# Patient Record
Sex: Male | Born: 1955 | Race: White | Hispanic: No | Marital: Married | Smoking: Never smoker
Health system: Southern US, Community
[De-identification: ages and names within clinical notes are randomized; demographics above are authoritative.]

---

## 2014-11-11 ENCOUNTER — Ambulatory Visit (INDEPENDENT_AMBULATORY_CARE_PROVIDER_SITE_OTHER): Payer: BLUE CROSS/BLUE SHIELD

## 2014-11-11 ENCOUNTER — Ambulatory Visit (INDEPENDENT_AMBULATORY_CARE_PROVIDER_SITE_OTHER): Payer: BLUE CROSS/BLUE SHIELD | Admitting: Family Medicine

## 2014-11-11 VITALS — BP 144/76 | HR 66 | Temp 98.5°F | Resp 16 | Ht 69.0 in | Wt 225.0 lb

## 2014-11-11 DIAGNOSIS — M5431 Sciatica, right side: Secondary | ICD-10-CM | POA: Diagnosis not present

## 2014-11-11 DIAGNOSIS — R030 Elevated blood-pressure reading, without diagnosis of hypertension: Secondary | ICD-10-CM

## 2014-11-11 DIAGNOSIS — M5441 Lumbago with sciatica, right side: Secondary | ICD-10-CM

## 2014-11-11 MED ORDER — CYCLOBENZAPRINE HCL 5 MG PO TABS: ORAL_TABLET | ORAL | Status: DC

## 2014-11-11 MED ORDER — MELOXICAM 7.5 MG PO TABS: 7.5000 mg | ORAL_TABLET | Freq: Every day | ORAL | Status: DC

## 2014-11-11 NOTE — Progress Notes (Signed)
Subjective:  This chart was scribed for Joshua Staggers, MD by [facility], medical scribe at [facility].The patient was seen in exam room 02 and the patient's care was started at 8:55 AM.   Patient ID: Joshua Wallace, male    DOB: 02-25-1956, 59 y.o.   MRN: [id number] Chief Complaint  Patient presents with  . Back Pain    right side, pain starts in lower back and goes down right leg. x 2 weeks  . Numbness    x 2 weeks   HPI HPI Comments: Joshua Wallace is a 59 y.o. male who presents to [facility] complaining of lower back pain that radiates down his right leg up to his calf with associated numbness. Numbness worsened with walking and radiates down to his foot. No known injury. No weakness of his right leg. Typically takes two Advil in the morning. 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 Walks frequently. 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 Injured his back then, so surgeries of injections at that time. No chest pain, leg swelling and sob. He does not smoke. No hx of HTN or blood clot. No urine or bowel incontinence, saddle anesthesia.  There are no active problems to display for this patient.  History reviewed. No pertinent past medical history. History reviewed. No pertinent past surgical history. Not on File Prior to Admission medications   Not on File   Social History   Social History  . Marital Status: Married    Spouse Name: N/A  . Number of Children: N/A  . Years of Education: N/A   Occupational History  . Not on file.   Social History Main Topics  . Smoking status: Never Smoker   . Smokeless tobacco: Not on file  . Alcohol Use: Not on file  . Drug Use: Not on file  . Sexual Activity: Not on file   Other Topics Concern  . Not on file   Social History Narrative  . No narrative on file   Review of Systems  Respiratory: Negative for shortness of breath.   Cardiovascular: Negative for chest pain and leg  swelling.  Musculoskeletal: Positive for back pain.  Neurological: Positive for numbness. Negative for weakness.       Objective:  BP 142/80 mmHg  Pulse 66  Temp(Src) 98.5 F (36.9 C) (Oral)  Resp 16  Ht  (1.753 m)  Wt 225 lb (102.059 kg)  BMI 33.21 kg/m2  SpO2 99% Physical Exam  Constitutional: He is oriented to person, place, and time. He appears well-developed and well-nourished. No distress.  HENT:  Head: Normocephalic and atraumatic.  Eyes: Pupils are equal, round, and reactive to light.  Neck: Normal range of motion.  Cardiovascular: Normal rate and regular rhythm.   Pulmonary/Chest: Effort normal. No respiratory distress.  Musculoskeletal: Normal range of motion.  Lumbar spine: Minimal discomfort paraspinal, and SI joint. Most tender at the sciatic notch. Minimal bony tenderness. Full flexion. Able to heel and toe walk w/o difficulty. Negative seated straight leg raise on the right.  Neurological: He is alert and oriented to person, place, and time. He displays no Babinski's sign on the right side. He displays no Babinski's sign on the left side.  Reflex Scores:      Patellar reflexes are 2+ on the right side and 2+ on the left side.      Achilles reflexes are 2+ on the right side and 2+ on  the left side. Skin: Skin is warm and dry.  Psychiatric: He has a normal mood and affect. His behavior is normal.  Nursing note and vitals reviewed. [facility] reading (PRIMARY) by Dr.Syesha Thaw of lumbar spine: slight right scoliosis, degenerative changes with osteophytes of L2, L3, L4. Decreased disc space L4-L5 and bridging osteophytes L4-L5      Assessment & Plan:   Mylen Mangan is a 59 y.o. male Right sciatic nerve pain - Plan: DG Lumbar Spine Complete, meloxicam (MOBIC) 7.5 MG tablet, cyclobenzaprine (FLEXERIL) 5 MG tablet  Right-sided low back pain with right-sided sciatica - Plan: DG Lumbar Spine Complete, meloxicam (MOBIC) 7.5 MG tablet, cyclobenzaprine (FLEXERIL) 5 MG  tablet  Elevated blood pressure reading without diagnosis of hypertension   -Suspected right sided sciatica, and based on x-ray may be L4-5 discogenic cause. Strength intact, reflexes equal and intact.    -Will try Mobic 7.5 mg daily, avoid NSAIDs. Watch blood pressures on this medication, as borderline here today. No known history of hypertension. If blood pressures remain elevated, stop Mobic and call for other instructions.  -Flexeril up to every 8 hours, side effects discussed, Su can try initially at night.  -Symptomatic care and handout given on sciatica. If not improving in the next week, consider prednisone. If worsening, consider orthopedic eval or MRI.   Meds ordered this encounter  Medications  . meloxicam (MOBIC) 7.5 MG tablet    Sig: Take 1 tablet (7.5 mg total) by mouth daily.    Dispense:  30 tablet    Refill:  0  . cyclobenzaprine (FLEXERIL) 5 MG tablet    Sig: 1 pill by mouth up to every 8 hours as needed. Start with one pill by mouth each bedtime as needed due to sedation    Dispense:  15 tablet    Refill:  0   Patient Instructions  Try the exercises and other information as listed below, meloxicam once during the day as needed (avoid taking other NSAIDS like Alleve or Ibuprofen while taking this) and then flexeril if needed at bedtime for muscle spasm. This can be taken up to every 8 hours, but causes sedation, so should not drive or operate heavy machinery while taking this medicine. Keep a record of your blood pressures outside of the office and if remaining over 140/90, we may need to stop the meloxicam has it can elevate blood pressure similarly to ibuprofen. Try to avoid repetitive bending or twisting, as well as heavy lifting over this next week. Follow-up with me in 1 week if your symptoms are not improving, as we may need to try prednisone or possible MRI of your back depending on your symptoms.Return to the clinic or go to the nearest emergency room if any of your  symptoms worsen or new symptoms occur.  Sciatica with Rehab The sciatic nerve runs from the back down the leg and is responsible for sensation and control of the muscles in the back (posterior) side of the thigh, lower leg, and foot. Sciatica is a condition that is characterized by inflammation of this nerve.  SYMPTOMS   Signs of nerve damage, including numbness and/or weakness along the posterior side of the lower extremity.  Pain in the back of the thigh that may also travel down the leg.  Pain that worsens when sitting for long periods of time.  Occasionally, pain in the back or buttock. CAUSES  Inflammation of the sciatic nerve is the cause of sciatica. The inflammation is due to something irritating the  nerve. Common sources of irritation include:  Sitting for long periods of time.  Direct trauma to the nerve.  Arthritis of the spine.  Herniated or ruptured disk.  Slipping of the vertebrae (spondylolisthesis).  Pressure from soft tissues, such as muscles or ligament-like tissue (fascia). RISK INCREASES WITH:  Sports that place pressure or stress on the spine (football or weightlifting).  Poor strength and flexibility.  Failure to warm up properly before activity.  Family history of low back pain or disk disorders.  Previous back injury or surgery.  Poor body mechanics, especially when lifting, or poor posture. PREVENTION   Warm up and stretch properly before activity.  Maintain physical fitness:  Strength, flexibility, and endurance.  Cardiovascular fitness.  Learn and use proper technique, especially with posture and lifting. When possible, have coach correct improper technique.  Avoid activities that place stress on the spine. PROGNOSIS If treated properly, then sciatica usually resolves within 6 weeks. However, occasionally surgery is necessary.  RELATED COMPLICATIONS   Permanent nerve damage, including pain, numbness, tingle, or weakness.  Chronic  back pain.  Risks of surgery: infection, bleeding, nerve damage, or damage to surrounding tissues. TREATMENT Treatment initially involves resting from any activities that aggravate your symptoms. The use of ice and medication may help reduce pain and inflammation. The use of strengthening and stretching exercises may help reduce pain with activity. These exercises may be performed at home or with referral to a therapist. A therapist may recommend further treatments, such as transcutaneous electronic nerve stimulation (TENS) or ultrasound. Your caregiver may recommend corticosteroid injections to help reduce inflammation of the sciatic nerve. If symptoms persist despite non-surgical (conservative) treatment, then surgery may be recommended. MEDICATION  If pain medication is necessary, then nonsteroidal anti-inflammatory medications, such as aspirin and ibuprofen, or other minor pain relievers, such as acetaminophen, are often recommended.  Do not take pain medication for 7 days before surgery.  Prescription pain relievers may be given if deemed necessary by your caregiver. Use only as directed and only as much as you need.  Ointments applied to the skin may be helpful.  Corticosteroid injections may be given by your caregiver. These injections should be reserved for the most serious cases, because they may only be given a certain number of times. HEAT AND COLD  Cold treatment (icing) relieves pain and reduces inflammation. Cold treatment should be applied for 10 to 15 minutes every 2 to 3 hours for inflammation and pain and immediately after any activity that aggravates your symptoms. Use ice packs or massage the area with a piece of ice (ice massage).  Heat treatment may be used prior to performing the stretching and strengthening activities prescribed by your caregiver, physical therapist, or athletic trainer. Use a heat pack or soak the injury in warm water. SEEK MEDICAL CARE IF:  Treatment  seems to offer no benefit, or the condition worsens.  Any medications produce adverse side effects. EXERCISES  RANGE OF MOTION (ROM) AND STRETCHING EXERCISES - Sciatica Most people with sciatic will find that their symptoms worsen with either excessive bending forward (flexion) or arching at the low back (extension). The exercises which will help resolve your symptoms will focus on the opposite motion. Your physician, physical therapist or athletic trainer will help you determine which exercises will be most helpful to resolve your low back pain. Do not complete any exercises without first consulting with your clinician. Discontinue any exercises which worsen your symptoms until you speak to your clinician. If you have  pain, numbness or tingling which travels down into your buttocks, leg or foot, the goal of the therapy is for these symptoms to move closer to your back and eventually resolve. Occasionally, these leg symptoms will get better, but your low back pain may worsen; this is typically an indication of progress in your rehabilitation. Be certain to be very alert to any changes in your symptoms and the activities in which you participated in the 24 hours prior to the change. Sharing this information with your clinician will allow him/her to most efficiently treat your condition. These exercises may help you when beginning to rehabilitate your injury. Your symptoms may resolve with or without further involvement from your physician, physical therapist or athletic trainer. While completing these exercises, remember:   Restoring tissue flexibility helps normal motion to return to the joints. This allows healthier, less painful movement and activity.  An effective stretch should be held for at least 30 seconds.  A stretch should never be painful. You should only feel a gentle lengthening or release in the stretched tissue. FLEXION RANGE OF MOTION AND STRETCHING EXERCISES: STRETCH - Flexion, Single  Knee to Chest   Lie on a firm bed or floor with both legs extended in front of you.  Keeping one leg in contact with the floor, bring your opposite knee to your chest. Hold your leg in place by either grabbing behind your thigh or at your knee.  Pull until you feel a gentle stretch in your low back. Hold __________ seconds.  Slowly release your grasp and repeat the exercise with the opposite side. Repeat __________ times. Complete this exercise __________ times per day.  STRETCH - Flexion, Double Knee to Chest  Lie on a firm bed or floor with both legs extended in front of you.  Keeping one leg in contact with the floor, bring your opposite knee to your chest.  Tense your stomach muscles to support your back and then lift your other knee to your chest. Hold your legs in place by either grabbing behind your thighs or at your knees.  Pull both knees toward your chest until you feel a gentle stretch in your low back. Hold __________ seconds.  Tense your stomach muscles and slowly return one leg at a time to the floor. Repeat __________ times. Complete this exercise __________ times per day.  STRETCH - Low Trunk Rotation   Lie on a firm bed or floor. Keeping your legs in front of you, bend your knees so they are both pointed toward the ceiling and your feet are flat on the floor.  Extend your arms out to the side. This will stabilize your upper body by keeping your shoulders in contact with the floor.  Gently and slowly drop both knees together to one side until you feel a gentle stretch in your low back. Hold for __________ seconds.  Tense your stomach muscles to support your low back as you bring your knees back to the starting position. Repeat the exercise to the other side. Repeat __________ times. Complete this exercise __________ times per day  EXTENSION RANGE OF MOTION AND FLEXIBILITY EXERCISES: STRETCH - Extension, Prone on Elbows  Lie on your stomach on the floor, a bed will  be too soft. Place your palms about shoulder width apart and at the height of your head.  Place your elbows under your shoulders. If this is too painful, stack pillows under your chest.  Allow your body to relax so that your hips drop  lower and make contact more completely with the floor.  Hold this position for __________ seconds.  Slowly return to lying flat on the floor. Repeat __________ times. Complete this exercise __________ times per day.  RANGE OF MOTION - Extension, Prone Press Ups  Lie on your stomach on the floor, a bed will be too soft. Place your palms about shoulder width apart and at the height of your head.  Keeping your back as relaxed as possible, slowly straighten your elbows while keeping your hips on the floor. You may adjust the placement of your hands to maximize your comfort. As you gain motion, your hands will come more underneath your shoulders.  Hold this position __________ seconds.  Slowly return to lying flat on the floor. Repeat __________ times. Complete this exercise __________ times per day.  STRENGTHENING EXERCISES - Sciatica  These exercises may help you when beginning to rehabilitate your injury. These exercises should be done near your "sweet spot." This is the neutral, low-back arch, somewhere between fully rounded and fully arched, that is your least painful position. When performed in this safe range of motion, these exercises can be used for people who have either a flexion or extension based injury. These exercises may resolve your symptoms with or without further involvement from your physician, physical therapist or athletic trainer. While completing these exercises, remember:   Muscles can gain both the endurance and the strength needed for everyday activities through controlled exercises.  Complete these exercises as instructed by your physician, physical therapist or athletic trainer. Progress with the resistance and repetition exercises only  as your caregiver advises.  You may experience muscle soreness or fatigue, but the pain or discomfort you are trying to eliminate should never worsen during these exercises. If this pain does worsen, stop and make certain you are following the directions exactly. If the pain is still present after adjustments, discontinue the exercise until you can discuss the trouble with your clinician. STRENGTHENING - Deep Abdominals, Pelvic Tilt   Lie on a firm bed or floor. Keeping your legs in front of you, bend your knees so they are both pointed toward the ceiling and your feet are flat on the floor.  Tense your lower abdominal muscles to press your low back into the floor. This motion will rotate your pelvis so that your tail bone is scooping upwards rather than pointing at your feet or into the floor.  With a gentle tension and even breathing, hold this position for __________ seconds. Repeat __________ times. Complete this exercise __________ times per day.  STRENGTHENING - Abdominals, Crunches   Lie on a firm bed or floor. Keeping your legs in front of you, bend your knees so they are both pointed toward the ceiling and your feet are flat on the floor. Cross your arms over your chest.  Slightly tip your chin down without bending your neck.  Tense your abdominals and slowly lift your trunk high enough to just clear your shoulder blades. Lifting higher can put excessive stress on the low back and does not further strengthen your abdominal muscles.  Control your return to the starting position. Repeat __________ times. Complete this exercise __________ times per day.  STRENGTHENING - Quadruped, Opposite UE/LE Lift  Assume a hands and knees position on a firm surface. Keep your hands under your shoulders and your knees under your hips. You may place padding under your knees for comfort.  Find your neutral spine and gently tense your abdominal muscles so  that you can maintain this position. Your  shoulders and hips should form a rectangle that is parallel with the floor and is not twisted.  Keeping your trunk steady, lift your right hand no higher than your shoulder and then your left leg no higher than your hip. Make sure you are not holding your breath. Hold this position __________ seconds.  Continuing to keep your abdominal muscles tense and your back steady, slowly return to your starting position. Repeat with the opposite arm and leg. Repeat __________ times. Complete this exercise __________ times per day.  STRENGTHENING - Abdominals and Quadriceps, Straight Leg Raise   Lie on a firm bed or floor with both legs extended in front of you.  Keeping one leg in contact with the floor, bend the other knee so that your foot can rest flat on the floor.  Find your neutral spine, and tense your abdominal muscles to maintain your spinal position throughout the exercise.  Slowly lift your straight leg off the floor about 6 inches for a count of 15, making sure to not hold your breath.  Still keeping your neutral spine, slowly lower your leg all the way to the floor. Repeat this exercise with each leg __________ times. Complete this exercise __________ times per day. POSTURE AND BODY MECHANICS CONSIDERATIONS - Sciatica Keeping correct posture when sitting, standing or completing your activities will reduce the stress put on different body tissues, allowing injured tissues a chance to heal and limiting painful experiences. The following are general guidelines for improved posture. Your physician or physical therapist will provide you with any instructions specific to your needs. While reading these guidelines, remember:  The exercises prescribed by your provider will help you have the flexibility and strength to maintain correct postures.  The correct posture provides the optimal environment for your joints to work. All of your joints have less wear and tear when properly supported by a spine  with good posture. This means you will experience a healthier, less painful body.  Correct posture must be practiced with all of your activities, especially prolonged sitting and standing. Correct posture is as important when doing repetitive low-stress activities (typing) as it is when doing a single heavy-load activity (lifting). RESTING POSITIONS Consider which positions are most painful for you when choosing a resting position. If you have pain with flexion-based activities (sitting, bending, stooping, squatting), choose a position that allows you to rest in a less flexed posture. You would want to avoid curling into a fetal position on your side. If your pain worsens with extension-based activities (prolonged standing, working overhead), avoid resting in an extended position such as sleeping on your stomach. Most people will find more comfort when they rest with their spine in a more neutral position, neither too rounded nor too arched. Lying on a non-sagging bed on your side with a pillow between your knees, or on your back with a pillow under your knees will often provide some relief. Keep in mind, being in any one position for a prolonged period of time, no matter how correct your posture, can still lead to stiffness. PROPER SITTING POSTURE In order to minimize stress and discomfort on your spine, you must sit with correct posture Sitting with good posture should be effortless for a healthy body. Returning to good posture is a gradual process. Many people can work toward this most comfortably by using various supports until they have the flexibility and strength to maintain this posture on their own. When sitting  with proper posture, your ears will fall over your shoulders and your shoulders will fall over your hips. You should use the back of the chair to support your upper back. Your low back will be in a neutral position, just slightly arched. You may place a small pillow or folded towel at the  base of your low back for support.  When working at a desk, create an environment that supports good, upright posture. Without extra support, muscles fatigue and lead to excessive strain on joints and other tissues. Keep these recommendations in mind: CHAIR:   A chair should be able to slide under your desk when your back makes contact with the back of the chair. This allows you to work closely.  The chair's height should allow your eyes to be level with the upper part of your monitor and your hands to be slightly lower than your elbows. BODY POSITION  Your feet should make contact with the floor. If this is not possible, use a foot rest.  Keep your ears over your shoulders. This will reduce stress on your neck and low back. INCORRECT SITTING POSTURES   If you are feeling tired and unable to assume a healthy sitting posture, do not slouch or slump. This puts excessive strain on your back tissues, causing more damage and pain. Healthier options include:  Using more support, like a lumbar pillow.  Switching tasks to something that requires you to be upright or walking.  Talking a brief walk.  Lying down to rest in a neutral-spine position. PROLONGED STANDING WHILE SLIGHTLY LEANING FORWARD  When completing a task that requires you to lean forward while standing in one place for a long time, place either foot up on a stationary 2-4 inch high object to help maintain the best posture. When both feet are on the ground, the low back tends to lose its slight inward curve. If this curve flattens (or becomes too large), then the back and your other joints will experience too much stress, fatigue more quickly and can cause pain.  CORRECT STANDING POSTURES Proper standing posture should be assumed with all daily activities, even if they only take a few moments, like when brushing your teeth. As in sitting, your ears should fall over your shoulders and your shoulders should fall over your hips. You  should keep a slight tension in your abdominal muscles to brace your spine. Your tailbone should point down to the ground, not behind your body, resulting in an over-extended swayback posture.  INCORRECT STANDING POSTURES  Common incorrect standing postures include a forward head, locked knees and/or an excessive swayback. WALKING Walk with an upright posture. Your ears, shoulders and hips should all line-up. PROLONGED ACTIVITY IN A FLEXED POSITION When completing a task that requires you to bend forward at your waist or lean over a low surface, try to find a way to stabilize 3 of 4 of your limbs. You can place a hand or elbow on your thigh or rest a knee on the surface you are reaching across. This will provide you more stability so that your muscles do not fatigue as quickly. By keeping your knees relaxed, or slightly bent, you will also reduce stress across your low back. CORRECT LIFTING TECHNIQUES DO :   Assume a wide stance. This will provide you more stability and the opportunity to get as close as possible to the object which you are lifting.  Tense your abdominals to brace your spine; then bend at the knees  and hips. Keeping your back locked in a neutral-spine position, lift using your leg muscles. Lift with your legs, keeping your back straight.  Test the weight of unknown objects before attempting to lift them.  Try to keep your elbows locked down at your sides in order get the best strength from your shoulders when carrying an object.  Always ask for help when lifting heavy or awkward objects. INCORRECT LIFTING TECHNIQUES DO NOT:   Lock your knees when lifting, even if it is a small object.  Bend and twist. Pivot at your feet or move your feet when needing to change directions.  Assume that you cannot safely pick up a paperclip without proper posture. Document Released: 02/12/2005 Document Revised: 06/29/2013 Document Reviewed: 05/27/2008 [facility] Patient Information 2015  [place], [place]. This information is not intended to replace advice given to you by your health care provider. Make sure you discuss any questions you have with your health care provider.    I personally performed the services described in this documentation, which was scribed in my presence. The recorded information has been reviewed and considered, and addended by me as needed.

## 2014-11-11 NOTE — Patient Instructions (Signed)
Try the exercises and other information as listed below, meloxicam once during the day as needed (avoid taking other NSAIDS like Alleve or Ibuprofen while taking this) and then flexeril if needed at bedtime for muscle spasm. This can be taken up to every 8 hours, but causes sedation, so should not drive or operate heavy machinery while taking this medicine. Keep a record of your blood pressures outside of the office and if remaining over 140/90, we may need to stop the meloxicam has it can elevate blood pressure similarly to ibuprofen. Try to avoid repetitive bending or twisting, as well as heavy lifting over this next week. Follow-up with me in 1 week if your symptoms are not improving, as we may need to try prednisone or possible MRI of your back depending on your symptoms.Return to the clinic or go to the nearest emergency room if any of your symptoms worsen or new symptoms occur.  Sciatica with Rehab The sciatic nerve runs from the back down the leg and is responsible for sensation and control of the muscles in the back (posterior) side of the thigh, lower leg, and foot. Sciatica is a condition that is characterized by inflammation of this nerve.  SYMPTOMS   Signs of nerve damage, including numbness and/or weakness along the posterior side of the lower extremity.  Pain in the back of the thigh that may also travel down the leg.  Pain that worsens when sitting for long periods of time.  Occasionally, pain in the back or buttock. CAUSES  Inflammation of the sciatic nerve is the cause of sciatica. The inflammation is due to something irritating the nerve. Common sources of irritation include:  Sitting for long periods of time.  Direct trauma to the nerve.  Arthritis of the spine.  Herniated or ruptured disk.  Slipping of the vertebrae (spondylolisthesis).  Pressure from soft tissues, such as muscles or ligament-like tissue (fascia). RISK INCREASES WITH:  Sports that place pressure or  stress on the spine (football or weightlifting).  Poor strength and flexibility.  Failure to warm up properly before activity.  Family history of low back pain or disk disorders.  Previous back injury or surgery.  Poor body mechanics, especially when lifting, or poor posture. PREVENTION   Warm up and stretch properly before activity.  Maintain physical fitness:  Strength, flexibility, and endurance.  Cardiovascular fitness.  Learn and use proper technique, especially with posture and lifting. When possible, have coach correct improper technique.  Avoid activities that place stress on the spine. PROGNOSIS If treated properly, then sciatica usually resolves within 6 weeks. However, occasionally surgery is necessary.  RELATED COMPLICATIONS   Permanent nerve damage, including pain, numbness, tingle, or weakness.  Chronic back pain.  Risks of surgery: infection, bleeding, nerve damage, or damage to surrounding tissues. TREATMENT Treatment initially involves resting from any activities that aggravate your symptoms. The use of ice and medication may help reduce pain and inflammation. The use of strengthening and stretching exercises may help reduce pain with activity. These exercises may be performed at home or with referral to a therapist. A therapist may recommend further treatments, such as transcutaneous electronic nerve stimulation (TENS) or ultrasound. Your caregiver may recommend corticosteroid injections to help reduce inflammation of the sciatic nerve. If symptoms persist despite non-surgical (conservative) treatment, then surgery may be recommended. MEDICATION  If pain medication is necessary, then nonsteroidal anti-inflammatory medications, such as aspirin and ibuprofen, or other minor pain relievers, such as acetaminophen, are often recommended.  Do not  take pain medication for 7 days before surgery.  Prescription pain relievers may be given if deemed necessary by your  caregiver. Use only as directed and only as much as you need.  Ointments applied to the skin may be helpful.  Corticosteroid injections may be given by your caregiver. These injections should be reserved for the most serious cases, because they may only be given a certain number of times. HEAT AND COLD  Cold treatment (icing) relieves pain and reduces inflammation. Cold treatment should be applied for 10 to 15 minutes every 2 to 3 hours for inflammation and pain and immediately after any activity that aggravates your symptoms. Use ice packs or massage the area with a piece of ice (ice massage).  Heat treatment may be used prior to performing the stretching and strengthening activities prescribed by your caregiver, physical therapist, or athletic trainer. Use a heat pack or soak the injury in warm water. SEEK MEDICAL CARE IF:  Treatment seems to offer no benefit, or the condition worsens.  Any medications produce adverse side effects. EXERCISES  RANGE OF MOTION (ROM) AND STRETCHING EXERCISES - Sciatica Most people with sciatic will find that their symptoms worsen with either excessive bending forward (flexion) or arching at the low back (extension). The exercises which will help resolve your symptoms will focus on the opposite motion. Your physician, physical therapist or athletic trainer will help you determine which exercises will be most helpful to resolve your low back pain. Do not complete any exercises without first consulting with your clinician. Discontinue any exercises which worsen your symptoms until you speak to your clinician. If you have pain, numbness or tingling which travels down into your buttocks, leg or foot, the goal of the therapy is for these symptoms to move closer to your back and eventually resolve. Occasionally, these leg symptoms will get better, but your low back pain may worsen; this is typically an indication of progress in your rehabilitation. Be certain to be very  alert to any changes in your symptoms and the activities in which you participated in the 24 hours prior to the change. Sharing this information with your clinician will allow him/her to most efficiently treat your condition. These exercises may help you when beginning to rehabilitate your injury. Your symptoms may resolve with or without further involvement from your physician, physical therapist or athletic trainer. While completing these exercises, remember:   Restoring tissue flexibility helps normal motion to return to the joints. This allows healthier, less painful movement and activity.  An effective stretch should be held for at least 30 seconds.  A stretch should never be painful. You should only feel a gentle lengthening or release in the stretched tissue. FLEXION RANGE OF MOTION AND STRETCHING EXERCISES: STRETCH - Flexion, Single Knee to Chest   Lie on a firm bed or floor with both legs extended in front of you.  Keeping one leg in contact with the floor, bring your opposite knee to your chest. Hold your leg in place by either grabbing behind your thigh or at your knee.  Pull until you feel a gentle stretch in your low back. Hold __________ seconds.  Slowly release your grasp and repeat the exercise with the opposite side. Repeat __________ times. Complete this exercise __________ times per day.  STRETCH - Flexion, Double Knee to Chest  Lie on a firm bed or floor with both legs extended in front of you.  Keeping one leg in contact with the floor, bring your opposite  knee to your chest.  Tense your stomach muscles to support your back and then lift your other knee to your chest. Hold your legs in place by either grabbing behind your thighs or at your knees.  Pull both knees toward your chest until you feel a gentle stretch in your low back. Hold __________ seconds.  Tense your stomach muscles and slowly return one leg at a time to the floor. Repeat __________ times. Complete  this exercise __________ times per day.  STRETCH - Low Trunk Rotation   Lie on a firm bed or floor. Keeping your legs in front of you, bend your knees so they are both pointed toward the ceiling and your feet are flat on the floor.  Extend your arms out to the side. This will stabilize your upper body by keeping your shoulders in contact with the floor.  Gently and slowly drop both knees together to one side until you feel a gentle stretch in your low back. Hold for __________ seconds.  Tense your stomach muscles to support your low back as you bring your knees back to the starting position. Repeat the exercise to the other side. Repeat __________ times. Complete this exercise __________ times per day  EXTENSION RANGE OF MOTION AND FLEXIBILITY EXERCISES: STRETCH - Extension, Prone on Elbows  Lie on your stomach on the floor, a bed will be too soft. Place your palms about shoulder width apart and at the height of your head.  Place your elbows under your shoulders. If this is too painful, stack pillows under your chest.  Allow your body to relax so that your hips drop lower and make contact more completely with the floor.  Hold this position for __________ seconds.  Slowly return to lying flat on the floor. Repeat __________ times. Complete this exercise __________ times per day.  RANGE OF MOTION - Extension, Prone Press Ups  Lie on your stomach on the floor, a bed will be too soft. Place your palms about shoulder width apart and at the height of your head.  Keeping your back as relaxed as possible, slowly straighten your elbows while keeping your hips on the floor. You may adjust the placement of your hands to maximize your comfort. As you gain motion, your hands will come more underneath your shoulders.  Hold this position __________ seconds.  Slowly return to lying flat on the floor. Repeat __________ times. Complete this exercise __________ times per day.  STRENGTHENING EXERCISES  - Sciatica  These exercises may help you when beginning to rehabilitate your injury. These exercises should be done near your "sweet spot." This is the neutral, low-back arch, somewhere between fully rounded and fully arched, that is your least painful position. When performed in this safe range of motion, these exercises can be used for people who have either a flexion or extension based injury. These exercises may resolve your symptoms with or without further involvement from your physician, physical therapist or athletic trainer. While completing these exercises, remember:   Muscles can gain both the endurance and the strength needed for everyday activities through controlled exercises.  Complete these exercises as instructed by your physician, physical therapist or athletic trainer. Progress with the resistance and repetition exercises only as your caregiver advises.  You may experience muscle soreness or fatigue, but the pain or discomfort you are trying to eliminate should never worsen during these exercises. If this pain does worsen, stop and make certain you are following the directions exactly. If the pain  is still present after adjustments, discontinue the exercise until you can discuss the trouble with your clinician. STRENGTHENING - Deep Abdominals, Pelvic Tilt   Lie on a firm bed or floor. Keeping your legs in front of you, bend your knees so they are both pointed toward the ceiling and your feet are flat on the floor.  Tense your lower abdominal muscles to press your low back into the floor. This motion will rotate your pelvis so that your tail bone is scooping upwards rather than pointing at your feet or into the floor.  With a gentle tension and even breathing, hold this position for __________ seconds. Repeat __________ times. Complete this exercise __________ times per day.  STRENGTHENING - Abdominals, Crunches   Lie on a firm bed or floor. Keeping your legs in front of you, bend  your knees so they are both pointed toward the ceiling and your feet are flat on the floor. Cross your arms over your chest.  Slightly tip your chin down without bending your neck.  Tense your abdominals and slowly lift your trunk high enough to just clear your shoulder blades. Lifting higher can put excessive stress on the low back and does not further strengthen your abdominal muscles.  Control your return to the starting position. Repeat __________ times. Complete this exercise __________ times per day.  STRENGTHENING - Quadruped, Opposite UE/LE Lift  Assume a hands and knees position on a firm surface. Keep your hands under your shoulders and your knees under your hips. You may place padding under your knees for comfort.  Find your neutral spine and gently tense your abdominal muscles so that you can maintain this position. Your shoulders and hips should form a rectangle that is parallel with the floor and is not twisted.  Keeping your trunk steady, lift your right hand no higher than your shoulder and then your left leg no higher than your hip. Make sure you are not holding your breath. Hold this position __________ seconds.  Continuing to keep your abdominal muscles tense and your back steady, slowly return to your starting position. Repeat with the opposite arm and leg. Repeat __________ times. Complete this exercise __________ times per day.  STRENGTHENING - Abdominals and Quadriceps, Straight Leg Raise   Lie on a firm bed or floor with both legs extended in front of you.  Keeping one leg in contact with the floor, bend the other knee so that your foot can rest flat on the floor.  Find your neutral spine, and tense your abdominal muscles to maintain your spinal position throughout the exercise.  Slowly lift your straight leg off the floor about 6 inches for a count of 15, making sure to not hold your breath.  Still keeping your neutral spine, slowly lower your leg all the way to  the floor. Repeat this exercise with each leg __________ times. Complete this exercise __________ times per day. POSTURE AND BODY MECHANICS CONSIDERATIONS - Sciatica Keeping correct posture when sitting, standing or completing your activities will reduce the stress put on different body tissues, allowing injured tissues a chance to heal and limiting painful experiences. The following are general guidelines for improved posture. Your physician or physical therapist will provide you with any instructions specific to your needs. While reading these guidelines, remember:  The exercises prescribed by your provider will help you have the flexibility and strength to maintain correct postures.  The correct posture provides the optimal environment for your joints to work. All of your  joints have less wear and tear when properly supported by a spine with good posture. This means you will experience a healthier, less painful body.  Correct posture must be practiced with all of your activities, especially prolonged sitting and standing. Correct posture is as important when doing repetitive low-stress activities (typing) as it is when doing a single heavy-load activity (lifting). RESTING POSITIONS Consider which positions are most painful for you when choosing a resting position. If you have pain with flexion-based activities (sitting, bending, stooping, squatting), choose a position that allows you to rest in a less flexed posture. You would want to avoid curling into a fetal position on your side. If your pain worsens with extension-based activities (prolonged standing, working overhead), avoid resting in an extended position such as sleeping on your stomach. Most people will find more comfort when they rest with their spine in a more neutral position, neither too rounded nor too arched. Lying on a non-sagging bed on your side with a pillow between your knees, or on your back with a pillow under your knees will  often provide some relief. Keep in mind, being in any one position for a prolonged period of time, no matter how correct your posture, can still lead to stiffness. PROPER SITTING POSTURE In order to minimize stress and discomfort on your spine, you must sit with correct posture Sitting with good posture should be effortless for a healthy body. Returning to good posture is a gradual process. Many people can work toward this most comfortably by using various supports until they have the flexibility and strength to maintain this posture on their own. When sitting with proper posture, your ears will fall over your shoulders and your shoulders will fall over your hips. You should use the back of the chair to support your upper back. Your low back will be in a neutral position, just slightly arched. You may place a small pillow or folded towel at the base of your low back for support.  When working at a desk, create an environment that supports good, upright posture. Without extra support, muscles fatigue and lead to excessive strain on joints and other tissues. Keep these recommendations in mind: CHAIR:   A chair should be able to slide under your desk when your back makes contact with the back of the chair. This allows you to work closely.  The chair's height should allow your eyes to be level with the upper part of your monitor and your hands to be slightly lower than your elbows. BODY POSITION  Your feet should make contact with the floor. If this is not possible, use a foot rest.  Keep your ears over your shoulders. This will reduce stress on your neck and low back. INCORRECT SITTING POSTURES   If you are feeling tired and unable to assume a healthy sitting posture, do not slouch or slump. This puts excessive strain on your back tissues, causing more damage and pain. Healthier options include:  Using more support, like a lumbar pillow.  Switching tasks to something that requires you to be upright  or walking.  Talking a brief walk.  Lying down to rest in a neutral-spine position. PROLONGED STANDING WHILE SLIGHTLY LEANING FORWARD  When completing a task that requires you to lean forward while standing in one place for a long time, place either foot up on a stationary 2-4 inch high object to help maintain the best posture. When both feet are on the ground, the low back tends  to lose its slight inward curve. If this curve flattens (or becomes too large), then the back and your other joints will experience too much stress, fatigue more quickly and can cause pain.  CORRECT STANDING POSTURES Proper standing posture should be assumed with all daily activities, even if they only take a few moments, like when brushing your teeth. As in sitting, your ears should fall over your shoulders and your shoulders should fall over your hips. You should keep a slight tension in your abdominal muscles to brace your spine. Your tailbone should point down to the ground, not behind your body, resulting in an over-extended swayback posture.  INCORRECT STANDING POSTURES  Common incorrect standing postures include a forward head, locked knees and/or an excessive swayback. WALKING Walk with an upright posture. Your ears, shoulders and hips should all line-up. PROLONGED ACTIVITY IN A FLEXED POSITION When completing a task that requires you to bend forward at your waist or lean over a low surface, try to find a way to stabilize 3 of 4 of your limbs. You can place a hand or elbow on your thigh or rest a knee on the surface you are reaching across. This will provide you more stability so that your muscles do not fatigue as quickly. By keeping your knees relaxed, or slightly bent, you will also reduce stress across your low back. CORRECT LIFTING TECHNIQUES DO :   Assume a wide stance. This will provide you more stability and the opportunity to get as close as possible to the object which you are lifting.  Tense your  abdominals to brace your spine; then bend at the knees and hips. Keeping your back locked in a neutral-spine position, lift using your leg muscles. Lift with your legs, keeping your back straight.  Test the weight of unknown objects before attempting to lift them.  Try to keep your elbows locked down at your sides in order get the best strength from your shoulders when carrying an object.  Always ask for help when lifting heavy or awkward objects. INCORRECT LIFTING TECHNIQUES DO NOT:   Lock your knees when lifting, even if it is a small object.  Bend and twist. Pivot at your feet or move your feet when needing to change directions.  Assume that you cannot safely pick up a paperclip without proper posture. Document Released: 02/12/2005 Document Revised: 06/29/2013 Document Reviewed: 05/27/2008 [facility] Patient Information 2015 [place], [place]. This information is not intended to replace advice given to you by your health care provider. Make sure you discuss any questions you have with your health care provider.

## 2015-12-05 ENCOUNTER — Ambulatory Visit (INDEPENDENT_AMBULATORY_CARE_PROVIDER_SITE_OTHER): Payer: BLUE CROSS/BLUE SHIELD | Admitting: Urgent Care

## 2015-12-05 VITALS — BP 124/80 | HR 70 | Temp 98.6°F | Resp 18 | Ht 69.0 in | Wt 225.0 lb

## 2015-12-05 DIAGNOSIS — M545 Low back pain, unspecified: Secondary | ICD-10-CM

## 2015-12-05 DIAGNOSIS — Z23 Encounter for immunization: Secondary | ICD-10-CM

## 2015-12-05 MED ORDER — MELOXICAM 7.5 MG PO TABS: 7.5000 mg | ORAL_TABLET | Freq: Every day | ORAL | 0 refills | Status: DC

## 2015-12-05 MED ORDER — CYCLOBENZAPRINE HCL 5 MG PO TABS: ORAL_TABLET | ORAL | 0 refills | Status: DC

## 2015-12-05 NOTE — Progress Notes (Signed)
Patient ID: Joshua Wallace, male   DOB: 05/30/1955, 60 y.o.   MRN: [id number]     By signing my name below, I, Essence Howell, attest that this documentation has been prepared under the direction and in the presence of Wallis BambergMario Mani, PA-C Electronically Signed: Charline BillsEssence Howell, ED Scribe 12/05/2015 at 9:03 AM.  MRN: [id number] DOB: 07/28/1955  Subjective:   Joshua Wallace is a 60 y.o. male presenting for chief complaint of gradually improving low right back pain onset 3 days ago. Pt states that he was changing the oil in his truck  3 days ago and initially felt the pain after standing. He describes low back pain as a constant, dull sensation with intermittent sharp, aching sensations that radiates into his buttocks with prolonged sitting. He has tried [employer] with mild relief. Pt denies numbness, tingling, constipation, dysuria, fever, nausea, vomiting, abdominal pain. He reports similar pain last year and had XRs done which did not show arthritic changes. No h/o liver or kidney disease. No h/o HTN.   Joshua Wallace is not currently taking any medications. Also has No Known Allergies.  Joshua Wallace  has no past medical history on file. Also  has no past surgical history on file.  Objective:   Vitals: BP 124/80 (BP Location: Right Arm, Patient Position: Sitting, Cuff Size: Small)    Pulse 70    Temp 98.6 F (37 C) (Oral)    Resp 18    Ht 5\' 9"  (1.753 m)    Wt 225 lb (102.1 kg)    SpO2 100%    BMI 33.23 kg/m   Physical Exam  Constitutional: He is oriented to person, place, and time. He appears well-developed and well-nourished.  Cardiovascular: Normal rate.   Pulmonary/Chest: Effort normal.  Abdominal: Soft. Bowel sounds are normal. There is no tenderness. There is no CVA tenderness.  Musculoskeletal: He exhibits tenderness (on R lumbar paraspinal muscles).  Decreased ROM on flexion and lateral flexion to the R. Negative straight leg raise bilaterally.  Neurological: He is alert and oriented to person,  place, and time.   Assessment and Plan :   1. Acute right-sided low back pain without sciatica - Recommended conservative management, reviewed back care, patient is to stop placing thick wallet in his back pocket. RTC in 1-2 weeks if no improvement. He declined x-rays today.   2. Need for prophylactic vaccination and inoculation against influenza - Flu Vaccine QUAD 36+ mos IM   Wallis BambergMario Mani, PA-C [facility] [phone] 12/05/2015 9:10 AM

## 2015-12-05 NOTE — Patient Instructions (Addendum)
Tylenol You may take 500mg  every 6 hours pain and inflammation of your back and muscle spasms.     Back Pain, Adult Back pain is very common in adults.The cause of back pain is rarely dangerous and the pain often gets better over time.The cause of your back pain may not be known. Some common causes of back pain include:  Strain of the muscles or ligaments supporting the spine.  Wear and tear (degeneration) of the spinal disks.  Arthritis.  Direct injury to the back. For many people, back pain may return. Since back pain is rarely dangerous, most people can learn to manage this condition on their own. HOME CARE INSTRUCTIONS Watch your back pain for any changes. The following actions may help to lessen any discomfort you are feeling:  Remain active. It is stressful on your back to sit or stand in one place for long periods of time. Do not sit, drive, or stand in one place for more than 30 minutes at a time. Take short walks on even surfaces as soon as you are able.Try to increase the length of time you walk each day.  Exercise regularly as directed by your health care provider. Exercise helps your back heal faster. It also helps avoid future injury by keeping your muscles strong and flexible.  Do not stay in bed.Resting more than 1-2 days can delay your recovery.  Pay attention to your body when you bend and lift. The most comfortable positions are those that put less stress on your recovering back. Always use proper lifting techniques, including:  Bending your knees.  Keeping the load close to your body.  Avoiding twisting.  Find a comfortable position to sleep. Use a firm mattress and lie on your side with your knees slightly bent. If you lie on your back, put a pillow under your knees.  Avoid feeling anxious or stressed.Stress increases muscle tension and can worsen back pain.It is important to recognize when you are anxious or stressed and learn ways to manage it, such as  with exercise.  Take medicines only as directed by your health care provider. Over-the-counter medicines to reduce pain and inflammation are often the most helpful.Your health care provider may prescribe muscle relaxant drugs.These medicines help dull your pain so you can more quickly return to your normal activities and healthy exercise.  Apply ice to the injured area:  Put ice in a plastic bag.  Place a towel between your skin and the bag.  Leave the ice on for 20 minutes, 2-3 times a day for the first 2-3 days. After that, ice and heat may be alternated to reduce pain and spasms.  Maintain a healthy weight. Excess weight puts extra stress on your back and makes it difficult to maintain good posture. SEEK MEDICAL CARE IF:  You have pain that is not relieved with rest or medicine.  You have increasing pain going down into the legs or buttocks.  You have pain that does not improve in one week.  You have night pain.  You lose weight.  You have a fever or chills. SEEK IMMEDIATE MEDICAL CARE IF:   You develop new bowel or bladder control problems.  You have unusual weakness or numbness in your arms or legs.  You develop nausea or vomiting.  You develop abdominal pain.  You feel faint.   This information is not intended to replace advice given to you by your health care provider. Make sure you discuss any questions you have with  your health care provider.   Document Released: 02/12/2005 Document Revised: 03/05/2014 Document Reviewed: 06/16/2013 Elsevier Interactive Patient Education 2016 [employer]     IF you received an x-ray today, you will receive an invoice from [facility]. Please contact [facility] at [phone] with questions or concerns regarding your invoice.   IF you received labwork today, you will receive an invoice from [facility]. Please contact [facility] at [phone] with questions or concerns regarding  your invoice.   Our billing staff will not be able to assist you with questions regarding bills from these companies.  You will be contacted with the lab results as soon as they are available. The fastest way to get your results is to activate your My Chart account. Instructions are located on the last page of this paperwork. If you have not heard from us regarding the results in 2 weeks, please contact this office.

## 2016-01-11 ENCOUNTER — Ambulatory Visit (INDEPENDENT_AMBULATORY_CARE_PROVIDER_SITE_OTHER): Payer: BLUE CROSS/BLUE SHIELD

## 2016-01-11 ENCOUNTER — Ambulatory Visit (INDEPENDENT_AMBULATORY_CARE_PROVIDER_SITE_OTHER): Payer: BLUE CROSS/BLUE SHIELD | Admitting: Family Medicine

## 2016-01-11 VITALS — BP 146/86 | HR 74 | Temp 98.9°F | Resp 17 | Ht 69.5 in | Wt 227.0 lb

## 2016-01-11 DIAGNOSIS — L03115 Cellulitis of right lower limb: Secondary | ICD-10-CM

## 2016-01-11 DIAGNOSIS — M79671 Pain in right foot: Secondary | ICD-10-CM | POA: Diagnosis not present

## 2016-01-11 DIAGNOSIS — M19071 Primary osteoarthritis, right ankle and foot: Secondary | ICD-10-CM | POA: Diagnosis not present

## 2016-01-11 MED ORDER — IBUPROFEN 600 MG PO TABS: 600.0000 mg | ORAL_TABLET | Freq: Three times a day (TID) | ORAL | 0 refills | Status: DC | PRN

## 2016-01-11 MED ORDER — DOXYCYCLINE HYCLATE 100 MG PO TABS: 100.0000 mg | ORAL_TABLET | Freq: Two times a day (BID) | ORAL | 0 refills | Status: DC

## 2016-01-11 NOTE — Progress Notes (Signed)
   Joshua Wallace is a 60 y.o. male who presents to [facility] today for Right foot pain:  1.  Right foot pain:  Patient's pain started on Sunday when he awoke. No new activities or injury last week or over the weekend. No trauma to the area. Pain is dorsum of foot. He also noted some redness to the area. He has been working for the past several days. States that his foot is swollen at the end of the day and feels a little numb in his toes. This resolves overnight when the swelling goes away.  No fevers or chills. No redness up his leg. No abdominal pain. No nausea vomiting.  No history of gout.   ROS as above.    PMH reviewed. Patient is a nonsmoker.   No past medical history on file. No past surgical history on file.  Medications reviewed. No current outpatient prescriptions on file.   No current facility-administered medications for this visit.      Physical Exam:  BP (!) 146/86 (BP Location: Right Arm, Patient Position: Sitting, Cuff Size: Normal)   Pulse 74   Temp 98.9 F (37.2 C) (Oral)   Resp 17   Ht 5' 9.5" (1.765 m)   Wt 227 lb (103 kg)   SpO2 98%   BMI 33.04 kg/m  Gen:  Alert, cooperative patient who appears stated age in no acute distress.  Vital signs reviewed. HEENT: EOMI,  MMM Pulm:  Clear to auscultation bilaterally with good air movement.  No wheezes or rales noted.   Cardiac:  Regular rate and rhythm without murmur auscultated.  Good S1/S2. Abd:  Soft/nondistended/nontender.  Good bowel sounds throughout all four quadrants.  No masses noted.  Left foot: Within normal limits Right foot: With erythema and edema noted. Across the dorsum of the foot. He does have point of maximal tenderness along the second metatarsal to palpation. This is also the area of maximal erythema. He seems to have what appears to be a healed scratch across this area as well. No crepitus or step-off. Sensation is fully intact in his distal toes. I'm unable to palpate a  dorsal pulse in either foot.  Walks without antalgic gait/limp.  Assessment and Plan:  1.  Right foot cellulitis: - radiographs revealed soft tissue swelling and bone spurs/degeneration but no fracture or other cause of swelling.   - cellulitis likely cause of erythema and edema. -Less likely gout because it is across dorsum of foot rather than one particular area.  Does have maximal point of tenderness along his metatarsal under what appears to be an old/prior scratch which is likely a tendinitis or entry point of infection. -Treating with doxycycline for the next 10 days. -Return to clinic if no improvement.

## 2016-01-11 NOTE — Patient Instructions (Addendum)
  Your x-rays looks good. They did show a bone spur which was the knob on your foot that I felt.  Take the doxycycline twice daily for the next 10 days. You do have cellulitis.  Take the ibuprofen 600 mg every 6-8 hours as needed for pain relief.  If you not feeling better by this weekend especially beginning of next week come back and see Joshua Wallace.  It was good to you today!   Cellulitis, Adult Introduction Cellulitis is a skin infection. The infected area is usually red and sore. This condition occurs most often in the arms and lower legs. It is very important to get treated for this condition. Follow these instructions at home:  Take over-the-counter and prescription medicines only as told by your doctor.  If you were prescribed an antibiotic medicine, take it as told by your doctor. Do not stop taking the antibiotic even if you start to feel better.  Drink enough fluid to keep your pee (urine) clear or pale yellow.  Do not touch or rub the infected area.  Raise (elevate) the infected area above the level of your heart while you are sitting or lying down.  Place warm or cold wet cloths (warm or cold compresses) on the infected area. Do this as told by your doctor.  Keep all follow-up visits as told by your doctor. This is important. These visits let your doctor make sure your infection is not getting worse. Contact a doctor if:  You have a fever.  Your symptoms do not get better after 1-2 days of treatment.  Your bone or joint under the infected area starts to hurt after the skin has healed.  Your infection comes back. This can happen in the same area or another area.  You have a swollen bump in the infected area.  You have new symptoms.  You feel ill and also have muscle aches and pains. Get help right away if:  Your symptoms get worse.  You feel very sleepy.  You throw up (vomit) or have watery poop (diarrhea) for a long time.  There are red streaks coming from the  infected area.  Your red area gets larger.  Your red area turns darker. This information is not intended to replace advice given to you by your health care provider. Make sure you discuss any questions you have with your health care provider. Document Released: 08/01/2007 Document Revised: 07/21/2015 Document Reviewed: 12/22/2014  2017 Elsevier    IF you received an x-ray today, you will receive an invoice from [facility]. Please contact [facility] at [phone] with questions or concerns regarding your invoice.   IF you received labwork today, you will receive an invoice from [facility]. Please contact [facility] at [phone] with questions or concerns regarding your invoice.   Our billing staff will not be able to assist you with questions regarding bills from these companies.  You will be contacted with the lab results as soon as they are available. The fastest way to get your results is to activate your My Chart account. Instructions are located on the last page of this paperwork. If you have not heard from Joshua Wallace regarding the results in 2 weeks, please contact this office.

## 2016-02-29 ENCOUNTER — Ambulatory Visit (INDEPENDENT_AMBULATORY_CARE_PROVIDER_SITE_OTHER): Payer: BLUE CROSS/BLUE SHIELD | Admitting: Family Medicine

## 2016-02-29 VITALS — BP 138/80 | HR 69 | Temp 98.1°F | Resp 16 | Ht 69.0 in | Wt 232.0 lb

## 2016-02-29 DIAGNOSIS — M779 Enthesopathy, unspecified: Secondary | ICD-10-CM

## 2016-02-29 MED ORDER — IBUPROFEN 600 MG PO TABS: 600.0000 mg | ORAL_TABLET | Freq: Three times a day (TID) | ORAL | 0 refills | Status: DC | PRN

## 2016-02-29 NOTE — Patient Instructions (Addendum)
It was good to see you again today.  You do have tendonitis of your Right foot.  This causes the swelling and pain you're having.    Take the 600 mg of ibuprofen for swelling and pain in your foot. You can take this as needed.  I think the best thing will be a new work boots. Give this about a week. If you're still having problems you may need to try some of the Dr. Margart SicklesScholl's inserts like we discussed.  You can get these at any pharmacy.    If you're still having trouble after that let us know.    IF you received an x-ray today, you will receive an invoice from [facility]. Please contact [facility] at [phone] with questions or concerns regarding your invoice.   IF you received labwork today, you will receive an invoice from [facility]. Please contact [facility] at [phone] with questions or concerns regarding your invoice.   Our billing staff will not be able to assist you with questions regarding bills from these companies.  You will be contacted with the lab results as soon as they are available. The fastest way to get your results is to activate your My Chart account. Instructions are located on the last page of this paperwork. If you have not heard from us regarding the results in 2 weeks, please contact this office.

## 2016-02-29 NOTE — Progress Notes (Signed)
   Joshua Wallace is a 61 y.o. male who presents to [facility] today for Right foot pain:  1.  Right foot pain:  Present for the past week. It is starting to get better. He made the appointment over the weekend. He is describing pain at the dorsum of his right foot. Worse when he stands or walks. Has not seen any redness or swelling here. He has had some slight numbness to his second and third toe on the right foot as well.  He recently was diagnosed about a month ago with cellulitis of the right foot. He took doxycycline and ibuprofen 600 mg and this resolved. He was doing well until last week. No injury or trauma to his foot. He does work outside as a [employer] and has old boots.  ROS as above.  No fevers or chills.   PMH reviewed. Patient is a nonsmoker.   No past medical history on file. No past surgical history on file.  Medications reviewed. Current Outpatient Prescriptions  Medication Sig Dispense Refill  . acetaminophen (TYLENOL) 325 MG tablet Take 650 mg by mouth every 6 (six) hours as needed.     No current facility-administered medications for this visit.      Physical Exam:  BP 138/80 (BP Location: Right Arm, Patient Position: Sitting, Cuff Size: Large)   Pulse 69   Temp 98.1 F (36.7 C) (Oral)   Resp 16   Ht 5\' 9"  (1.753 m)   Wt 232 lb (105.2 kg)   SpO2 99%   BMI 34.26 kg/m  Gen:  Alert, cooperative patient who appears stated age in no acute distress.  Vital signs reviewed. MSk:  Left foot within normal limits. Right foot with tenderness directly over the dorsum of his right foot on the second metacarpal. No crepitus here. Tenderness is 4 out of 10. No redness. No swelling. Plantar flexion is 5 out of 5. Dorsiflexion also 5 out of 5. He has intact sensation in all 5 toes currently.  Pes planus noted on the right.  Assessment and Plan:  1.  Right foot tendinitis: -No evidence of cellulitis or gout today. -He states that he gets new pair shoes at  the beginning of each year. These are work Hydrologistissued. He has not yet picked up his new work boots. - He does have evidence of fall in arch. -Treat with ibuprofen as needed for tendinitis. I think that once he gets his new boots this will correct his issues. He can try inserts if not. Follow-up when necessary.

## 2016-05-02 ENCOUNTER — Ambulatory Visit (INDEPENDENT_AMBULATORY_CARE_PROVIDER_SITE_OTHER): Payer: BLUE CROSS/BLUE SHIELD | Admitting: Family Medicine

## 2016-05-02 ENCOUNTER — Encounter: Payer: Self-pay | Admitting: Family Medicine

## 2016-05-02 VITALS — BP 144/80 | HR 90 | Temp 98.5°F | Resp 18 | Ht 69.0 in | Wt 227.0 lb

## 2016-05-02 DIAGNOSIS — Z202 Contact with and (suspected) exposure to infections with a predominantly sexual mode of transmission: Secondary | ICD-10-CM

## 2016-05-02 DIAGNOSIS — R030 Elevated blood-pressure reading, without diagnosis of hypertension: Secondary | ICD-10-CM | POA: Diagnosis not present

## 2016-05-02 NOTE — Patient Instructions (Addendum)
   IF you received an x-ray today, you will receive an invoice from [facility]. Please contact [facility] at [phone] with questions or concerns regarding your invoice.   IF you received labwork today, you will receive an invoice from [facility]. Please contact [facility] at [phone] with questions or concerns regarding your invoice.   Our billing staff will not be able to assist you with questions regarding bills from these companies.  You will be contacted with the lab results as soon as they are available. The fastest way to get your results is to activate your My Chart account. Instructions are located on the last page of this paperwork. If you have not heard from us regarding the results in 2 weeks, please contact this office.      Trichomoniasis Trichomoniasis is an STI (sexually transmitted infection) that can affect both women and men. In women, the outer area of the male genitalia (vulva) and the vagina are affected. In men, the penis is mainly affected, but the prostate and other reproductive organs can also be involved. This condition can be treated with medicine. It often has no symptoms (is asymptomatic), especially in men. What are the causes? This condition is caused by an organism called Trichomonas vaginalis. Trichomoniasis most often spreads from person to person (is contagious) through sexual contact. What increases the risk? The following factors may make you more likely to develop this condition:  Having unprotected sexual intercourse.  Having sexual intercourse with a partner who has trichomoniasis.  Having multiple sexual partners.  Having had previous trichomoniasis infections or other STIs.  What are the signs or symptoms? In women, symptoms of trichomoniasis include:  Abnormal vaginal discharge that is clear, white, gray, or yellow-green and foamy and has an unusual "fishy" odor.  Itching and irritation of the vagina and  vulva.  Burning or pain during urination or sexual intercourse.  Genital redness and swelling.  In men, symptoms of trichomoniasis include:  Penile discharge that may be foamy or contain pus.  Pain in the penis. This may happen only when urinating.  Itching or irritation inside the penis.  Burning after urination or ejaculation.  How is this diagnosed? In women, this condition may be found during a routine Pap test or physical exam. It may be found in men during a routine physical exam. Your health care provider may perform tests to help diagnose this infection, such as:  Urine tests (men and women).  The following in women: ? Testing the pH of the vagina. ? A vaginal swab test that checks for the Trichomonas vaginalis organism. ? Testing vaginal secretions.  Your health care provider may test you for other STIs, including HIV (human immunodeficiency virus). How is this treated? This condition is treated with medicine taken by mouth (orally), such as metronidazole or tinidazole to fight the infection. Your sexual partner(s) may also need to be tested and treated.  If you are a woman and you plan to become pregnant or think you may be pregnant, tell your health care provider right away. Some medicines that are used to treat the infection should not be taken during pregnancy.  Your health care provider may recommend over-the-counter medicines or creams to help relieve itching or irritation. You may be tested for infection again 3 months after treatment. Follow these instructions at home:  Take and use over-the-counter and prescription medicines, including creams, only as told by your health care provider.  Do not have sexual intercourse until one week after   you finish your medicine, or until your health care provider approves. Ask your health care provider when you may resume sexual intercourse.  (Women) Do not douche or wear tampons while you have the infection.  Discuss your  infection with your sexual partner(s). Make sure that your partner gets tested and treated, if necessary.  Keep all follow-up visits as told by your health care provider. This is important. How is this prevented?  Use condoms every time you have sex. Using condoms correctly and consistently can help protect against STIs.  Avoid having multiple sexual partners.  Talk with your sexual partner about any symptoms that either of you may have, as well as any history of STIs.  Get tested for STIs and STDs (sexually transmitted diseases) before you have sex. Ask your partner to do the same.  Do not have sexual contact if you have symptoms of trichomoniasis or another STI. Contact a health care provider if:  You still have symptoms after you finish your medicine.  You develop pain in your abdomen.  You have pain when you urinate.  You have bleeding after sexual intercourse.  You develop a rash.  You feel nauseous or you vomit.  You plan to become pregnant or think you may be pregnant. Summary  Trichomoniasis is an STI (sexually transmitted infection) that can affect both women and men.  This condition often has no symptoms (is asymptomatic), especially in men.  You should not have sexual intercourse until one week after you finish your medicine, or until your health care provider approves. Ask your health care provider when you may resume sexual intercourse.  Discuss your infection with your sexual partner. Make sure that your partner gets tested and treated, if necessary. This information is not intended to replace advice given to you by your health care provider. Make sure you discuss any questions you have with your health care provider. Document Released: 08/08/2000 Document Revised: 01/06/2016 Document Reviewed: 01/06/2016 Elsevier Interactive Patient Education  2017 [employer] Inc.  

## 2016-05-02 NOTE — Progress Notes (Signed)
  Chief Complaint  Patient presents with  . Exposure to STD    wife was diagnosed with trichomonas     HPI   Patient is here because her wife had trichomonas during a routine pap smear.  He denies exposure from other sexual contact and thinks if he has anything it came from his wife.  He denies urinary symptoms like urgency or frequency. He denies penile discharge.   Elevated bp  Pt reports that he does not have a history of hypertension but has been stressed since his wife told him about the infection yesterday.  He also rushed to get to this appointment.  He denies headaches. CP or sob.  BP Readings from Last 3 Encounters:  05/02/16 (!) 144/80  02/29/16 138/80  01/11/16 (!) 146/86     No past medical history on file.  Current Outpatient Prescriptions  Medication Sig Dispense Refill  . acetaminophen (TYLENOL) 325 MG tablet Take 650 mg by mouth every 6 (six) hours as needed.    Marland Kitchen. ibuprofen (ADVIL,MOTRIN) 600 MG tablet Take 1 tablet (600 mg total) by mouth every 8 (eight) hours as needed. (Patient not taking: Reported on 05/02/2016) 30 tablet 0   No current facility-administered medications for this visit.     Allergies: No Known Allergies  No past surgical history on file.  Social History   Social History  . Marital status: Married    Spouse name: N/A  . Number of children: N/A  . Years of education: N/A   Social History Main Topics  . Smoking status: Never Smoker  . Smokeless tobacco: Never Used  . Alcohol use 0.0 oz/week  . Drug use: No  . Sexual activity: Not Asked   Other Topics Concern  . None   Social History Narrative  . None    ROS  Objective: Vitals:   05/02/16 1718 05/02/16 1739  BP: (!) 151/79 (!) 144/80  Pulse: 90   Resp: 18   Temp: 98.5 F (36.9 C)   TempSrc: Oral   SpO2: 100%   Weight: 227 lb (103 kg)   Height: 5\' 9"  (1.753 m)     Physical Exam  Constitutional: He is oriented to person, place, and time. He appears well-developed and  well-nourished.  HENT:  Head: Normocephalic and atraumatic.  Eyes: Conjunctivae and EOM are normal.  Cardiovascular: Normal rate, regular rhythm and normal heart sounds.   No murmur heard. Pulmonary/Chest: Effort normal and breath sounds normal. No respiratory distress. He has no wheezes.  Neurological: He is alert and oriented to person, place, and time.  Psychiatric: He has a normal mood and affect. His behavior is normal. Judgment and thought content normal.    Assessment and Plan Joshua Wallace was seen today for exposure to std.  Diagnoses and all orders for this visit:  Exposure to STD -     Trichomonas vaginalis, RNA -     GC/Chlamydia Probe Amp  Trichomonas exposure- will check urine tests Pt declined std screening by blood today  Elevated blood pressure reading without diagnosis of hypertension- pt has had 2 episodes of elevated bp He states that it is very  Normal outside of the previous urgent care setting He should return for full physical      Joshua Wallace

## 2016-05-04 LAB — GC/CHLAMYDIA PROBE AMP
CHLAMYDIA, DNA PROBE: NEGATIVE
Neisseria gonorrhoeae by PCR: NEGATIVE

## 2016-05-05 LAB — TRICHOMONAS VAGINALIS, PROBE AMP: TRICH VAG BY NAA: POSITIVE — AB

## 2016-05-09 ENCOUNTER — Telehealth: Payer: Self-pay | Admitting: Family Medicine

## 2016-05-09 MED ORDER — METRONIDAZOLE 500 MG PO TABS: 500.0000 mg | ORAL_TABLET | Freq: Two times a day (BID) | ORAL | 0 refills | Status: DC

## 2016-05-09 NOTE — Telephone Encounter (Signed)
Pt notified of result. Flagyl sent in. Condom use advised.

## 2016-07-24 ENCOUNTER — Encounter: Payer: Self-pay | Admitting: Family Medicine

## 2016-07-24 ENCOUNTER — Ambulatory Visit (INDEPENDENT_AMBULATORY_CARE_PROVIDER_SITE_OTHER): Payer: BLUE CROSS/BLUE SHIELD | Admitting: Family Medicine

## 2016-07-24 VITALS — BP 160/84 | HR 75 | Temp 98.5°F | Resp 18 | Ht 69.0 in | Wt 225.0 lb

## 2016-07-24 DIAGNOSIS — T148XXA Other injury of unspecified body region, initial encounter: Secondary | ICD-10-CM | POA: Diagnosis not present

## 2016-07-24 DIAGNOSIS — E6609 Other obesity due to excess calories: Secondary | ICD-10-CM | POA: Diagnosis not present

## 2016-07-24 DIAGNOSIS — R030 Elevated blood-pressure reading, without diagnosis of hypertension: Secondary | ICD-10-CM | POA: Diagnosis not present

## 2016-07-24 DIAGNOSIS — Z13220 Encounter for screening for lipoid disorders: Secondary | ICD-10-CM

## 2016-07-24 DIAGNOSIS — Z131 Encounter for screening for diabetes mellitus: Secondary | ICD-10-CM | POA: Diagnosis not present

## 2016-07-24 DIAGNOSIS — Z6833 Body mass index (BMI) 33.0-33.9, adult: Secondary | ICD-10-CM

## 2016-07-24 DIAGNOSIS — Z125 Encounter for screening for malignant neoplasm of prostate: Secondary | ICD-10-CM

## 2016-07-24 MED ORDER — NAPROXEN 500 MG PO TABS: 500.0000 mg | ORAL_TABLET | Freq: Two times a day (BID) | ORAL | 0 refills | Status: AC

## 2016-07-24 MED ORDER — CYCLOBENZAPRINE HCL 10 MG PO TABS: 10.0000 mg | ORAL_TABLET | Freq: Three times a day (TID) | ORAL | 0 refills | Status: AC | PRN

## 2016-07-24 NOTE — Patient Instructions (Addendum)
Aspercreme with Lidocaine for left muscle strain   IF you received an x-ray today, you will receive an invoice from [facility]. Please contact [facility] at [phone] with questions or concerns regarding your invoice.   IF you received labwork today, you will receive an invoice from [facility]. Please contact [facility] at [phone] with questions or concerns regarding your invoice.   Our billing staff will not be able to assist you with questions regarding bills from these companies.  You will be contacted with the lab results as soon as they are available. The fastest way to get your results is to activate your My Chart account. Instructions are located on the last page of this paperwork. If you have not heard from us regarding the results in 2 weeks, please contact this office.     Preventing Hypertension Hypertension, commonly called high blood pressure, is when the force of blood pumping through the arteries is too strong. Arteries are blood vessels that carry blood from the heart throughout the body. Over time, hypertension can damage the arteries and decrease blood flow to important parts of the body, including the brain, heart, and kidneys. Often, hypertension does not cause symptoms until blood pressure is very high. For this reason, it is important to have your blood pressure checked on a regular basis. Hypertension can often be prevented with diet and lifestyle changes. If you already have hypertension, you can control it with diet and lifestyle changes, as well as medicine. What nutrition changes can be made? Maintain a healthy diet. This includes:  Eating less salt (sodium). Ask your health care provider how much sodium is safe for you to have. The general recommendation is to consume less than 1 tsp (2,300 mg) of sodium a day.  Do not add salt to your food.  Choose low-sodium options when grocery shopping and eating out.  Limiting fats in your diet. You  can do this by eating low-fat or fat-free dairy products and by eating less red meat.  Eating more fruits, vegetables, and whole grains. Make a goal to eat:  1-2 cups of fresh fruits and vegetables each day.  3-4 servings of whole grains each day.  Avoiding foods and beverages that have added sugars.  Eating fish that contain healthy fats (omega-3 fatty acids), such as mackerel or salmon. If you need help putting together a healthy eating plan, try the DASH diet. This diet is high in fruits, vegetables, and whole grains. It is low in sodium, red meat, and added sugars. DASH stands for Dietary Approaches to Stop Hypertension. What lifestyle changes can be made?  Lose weight if you are overweight. Losing just 3?5% of your body weight can help prevent or control hypertension.  For example, if your present weight is 200 lb (91 kg), a loss of 3-5% of your weight means losing 6-10 lb (2.7-4.5 kg).  Ask your health care provider to help you with a diet and exercise plan to safely lose weight.  Get enough exercise. Do at least 150 minutes of moderate-intensity exercise each week.  You could do this in short exercise sessions several times a day, or you could do longer exercise sessions a few times a week. For example, you could take a brisk 10-minute walk or bike ride, 3 times a day, for 5 days a week.  Find ways to reduce stress, such as exercising, meditating, listening to music, or taking a yoga class. If you need help reducing stress, ask your health care provider.  Do not smoke. This includes e-cigarettes. Chemicals in tobacco and nicotine products raise your blood pressure each time you smoke. If you need help quitting, ask your health care provider.  Avoid alcohol. If you drink alcohol, limit alcohol intake to no more than 1 drink a day for nonpregnant women and 2 drinks a day for men. One drink equals 12 oz of beer, 5 oz of wine, or 1 oz of hard liquor. Why are these changes  important? Diet and lifestyle changes can help you prevent hypertension, and they may make you feel better overall and improve your quality of life. If you have hypertension, making these changes will help you control it and help prevent major complications, such as:  Hardening and narrowing of arteries that supply blood to:  Your heart. This can cause a heart attack.  Your brain. This can cause a stroke.  Your kidneys. This can cause kidney failure.  Stress on your heart muscle, which can cause heart failure. What can I do to lower my risk?  Work with your health care provider to make a hypertension prevention plan that works for you. Follow your plan and keep all follow-up visits as told by your health care provider.  Learn how to check your blood pressure at home. Make sure that you know your personal target blood pressure, as told by your health care provider. How is this treated? In addition to diet and lifestyle changes, your health care provider may recommend medicines to help lower your blood pressure. You may need to try a few different medicines to find what works best for you. You also may need to take more than one medicine. Take over-the-counter and prescription medicines only as told by your health care provider. Where to find support: Your health care provider can help you prevent hypertension and help you keep your blood pressure at a healthy level. Your local hospital or your community may also provide support services and prevention programs. The American Heart Association offers an online support network at: https://www.lee.net/ Where to find more information: Learn more about hypertension from:  National Heart, Lung, and Blood Institute: https://www.peterson.org/  Centers for Disease Control and Prevention: AboutHD.co.nz  American Academy of Family Physicians:  http://familydoctor.org/familydoctor/en/diseases-conditions/high-blood-pressure.printerview.all.html Learn more about the DASH diet from:  National Heart, Lung, and Blood Institute: WedMap.it Contact a health care provider if:  You think you are having a reaction to medicines you have taken.  You have recurrent headaches or feel dizzy.  You have swelling in your ankles.  You have trouble with your vision. Summary  Hypertension often does not cause any symptoms until blood pressure is very high. It is important to get your blood pressure checked regularly.  Diet and lifestyle changes are the most important steps in preventing hypertension.  By keeping your blood pressure in a healthy range, you can prevent complications like heart attack, heart failure, stroke, and kidney failure.  Work with your health care provider to make a hypertension prevention plan that works for you. This information is not intended to replace advice given to you by your health care provider. Make sure you discuss any questions you have with your health care provider. Document Released: 02/27/2015 Document Revised: 10/24/2015 Document Reviewed: 10/24/2015 Elsevier Interactive Patient Education  2017 [employer].

## 2016-07-24 NOTE — Progress Notes (Signed)
Chief Complaint  Patient presents with  . Shoulder Pain    Left shoulder x 1 week. No Known Injury    HPI  Patient reports that he has been having left shoulder painfor one week He was merging lanes and did a head check that lead to burning pain in his shoulder and is now down his arm His hand is not weak But looking over the shoulder feels pain He feels like something in one spot in pressing and causing the pain He felt dullness in the area of the left shoulder that was like a dull throb When he is sleeping he has to lay a certain way If he gets up too quickly he can feel the pain go down the arm. He took tylenol for pain Pain is 6/10  Elevated blood pressure Pt denies a history of hypertension He is in pain today  No chest pain or palpitations  BP Readings from Last 3 Encounters:  07/24/16 (!) 160/84  05/02/16 (!) 144/80  02/29/16 138/80    No past medical history on file.  Current Outpatient Prescriptions  Medication Sig Dispense Refill  . acetaminophen (TYLENOL) 325 MG tablet Take 650 mg by mouth every 6 (six) hours as needed.    . cyclobenzaprine (FLEXERIL) 10 MG tablet Take 1 tablet (10 mg total) by mouth 3 (three) times daily as needed for muscle spasms. 30 tablet 0  . naproxen (NAPROSYN) 500 MG tablet Take 1 tablet (500 mg total) by mouth 2 (two) times daily with a meal. For no more than 2 weeks. 30 tablet 0   No current facility-administered medications for this visit.     Allergies: No Known Allergies  No past surgical history on file.  Social History   Social History  . Marital status: Married    Spouse name: N/A  . Number of children: N/A  . Years of education: N/A   Social History Main Topics  . Smoking status: Never Smoker  . Smokeless tobacco: Never Used  . Alcohol use 0.0 oz/week  . Drug use: No  . Sexual activity: Not Asked   Other Topics Concern  . None   Social History Narrative  . None    Review of Systems  Constitutional:  Negative for chills and fever.  Eyes: Negative for blurred vision and double vision.  Respiratory: Negative for cough, shortness of breath and wheezing.   Cardiovascular: Negative for chest pain, palpitations and orthopnea.  Gastrointestinal: Negative for abdominal pain, nausea and vomiting.  Musculoskeletal: Positive for back pain, myalgias and neck pain.  Neurological: Negative for dizziness and headaches.    Objective: Vitals:   07/24/16 1351 07/24/16 1353  BP: (!) 184/93 (!) 160/84  Pulse: 75   Resp: 18   Temp: 98.5 F (36.9 C)   TempSrc: Oral   SpO2: 100%   Weight: 225 lb (102.1 kg)   Height: 5\' 9"  (1.753 m)   Body mass index is 33.23 kg/m.   Physical Exam  Constitutional: He is oriented to person, place, and time. He appears well-developed and well-nourished.  HENT:  Head: Normocephalic and atraumatic.  Eyes: Conjunctivae and EOM are normal.  Neck: Normal range of motion. Neck supple.  Cardiovascular: Normal rate, regular rhythm and normal heart sounds.   Pulmonary/Chest: Effort normal and breath sounds normal. No respiratory distress. He has no wheezes.  Musculoskeletal:       Right shoulder: He exhibits normal range of motion, no tenderness, no deformity, no laceration and no pain.  Left shoulder: He exhibits normal range of motion, no tenderness, no deformity, no laceration and no pain.       Cervical back: He exhibits tenderness and spasm. He exhibits normal range of motion, no bony tenderness, no swelling, no edema and no deformity.       Back:  Neurological: He is alert and oriented to person, place, and time.    Assessment and Plan Joshua Wallace was seen today for shoulder pain.  Diagnoses and all orders for this visit:  Muscle strain- advised to follow up with PT for trapezius muscle strain  Advised rest, hydration and topical analgesia as well as nsaids and flexeril -     Ambulatory referral to Physical Therapy -     naproxen (NAPROSYN) 500 MG tablet;  Take 1 tablet (500 mg total) by mouth 2 (two) times daily with a meal. For no more than 2 weeks. -     cyclobenzaprine (FLEXERIL) 10 MG tablet; Take 1 tablet (10 mg total) by mouth 3 (three) times daily as needed for muscle spasms.  Elevated BP without diagnosis of hypertension- likely due to pain Will reassess when pt is not hurting   Screening PSA (prostate specific antigen)- pt is 60 and has not been screened. No LUTS -     PSA  Screening for diabetes mellitus -     Hemoglobin A1c  Class 1 obesity due to excess calories without serious comorbidity with body mass index (BMI) of 33.0 to 33.9 in adult -     Lipid panel  Screening, lipid -     Lipid panel  Other orders -     naproxen (NAPROSYN) 500 MG tablet; Take 1 tablet (500 mg total) by mouth 2 (two) times daily with a meal. For no more than 2 weeks. -     cyclobenzaprine (FLEXERIL) 10 MG tablet; Take 1 tablet (10 mg total) by mouth 3 (three) times daily as needed for muscle spasms.       Joshua Wallace

## 2016-07-25 LAB — COMPREHENSIVE METABOLIC PANEL
ALBUMIN: 4.5 g/dL (ref 3.5–5.5)
ALT: 34 IU/L (ref 0–44)
AST: 24 IU/L (ref 0–40)
Albumin/Globulin Ratio: 1.4 (ref 1.2–2.2)
Alkaline Phosphatase: 74 IU/L (ref 39–117)
BUN / CREAT RATIO: 12 (ref 9–20)
BUN: 14 mg/dL (ref 6–24)
Bilirubin Total: 0.5 mg/dL (ref 0.0–1.2)
CALCIUM: 9.2 mg/dL (ref 8.7–10.2)
CO2: 21 mmol/L (ref 18–29)
Chloride: 100 mmol/L (ref 96–106)
Creatinine, Ser: 1.16 mg/dL (ref 0.76–1.27)
GFR, EST AFRICAN AMERICAN: 79 mL/min/{1.73_m2} (ref >59)
GFR, EST NON AFRICAN AMERICAN: 69 mL/min/{1.73_m2} (ref >59)
GLOBULIN, TOTAL: 3.3 g/dL (ref 1.5–4.5)
Glucose: 121 mg/dL — ABNORMAL HIGH (ref 65–99)
Potassium: 4.3 mmol/L (ref 3.5–5.2)
SODIUM: 136 mmol/L (ref 134–144)
TOTAL PROTEIN: 7.8 g/dL (ref 6.0–8.5)

## 2016-07-25 LAB — LIPID PANEL
CHOLESTEROL TOTAL: 131 mg/dL (ref 100–199)
Chol/HDL Ratio: 2.5 ratio (ref 0.0–5.0)
HDL: 52 mg/dL (ref >39)
LDL CALC: 36 mg/dL (ref 0–99)
TRIGLYCERIDES: 214 mg/dL — AB (ref 0–149)
VLDL Cholesterol Cal: 43 mg/dL — ABNORMAL HIGH (ref 5–40)

## 2016-07-25 LAB — PSA: Prostate Specific Ag, Serum: 0.7 ng/mL (ref 0.0–4.0)

## 2016-07-25 LAB — HEMOGLOBIN A1C
Est. average glucose Bld gHb Est-mCnc: 140 mg/dL
Hgb A1c MFr Bld: 6.5 % — ABNORMAL HIGH (ref 4.8–5.6)

## 2016-07-31 DIAGNOSIS — M542 Cervicalgia: Secondary | ICD-10-CM | POA: Diagnosis not present

## 2016-07-31 DIAGNOSIS — X501XXD Overexertion from prolonged static or awkward postures, subsequent encounter: Secondary | ICD-10-CM | POA: Diagnosis not present

## 2016-07-31 DIAGNOSIS — M25512 Pain in left shoulder: Secondary | ICD-10-CM | POA: Diagnosis not present

## 2016-07-31 DIAGNOSIS — R293 Abnormal posture: Secondary | ICD-10-CM | POA: Diagnosis not present

## 2016-08-07 DIAGNOSIS — M25512 Pain in left shoulder: Secondary | ICD-10-CM | POA: Diagnosis not present

## 2016-08-07 DIAGNOSIS — X501XXD Overexertion from prolonged static or awkward postures, subsequent encounter: Secondary | ICD-10-CM | POA: Diagnosis not present

## 2016-08-07 DIAGNOSIS — M542 Cervicalgia: Secondary | ICD-10-CM | POA: Diagnosis not present

## 2016-08-07 DIAGNOSIS — R293 Abnormal posture: Secondary | ICD-10-CM | POA: Diagnosis not present

## 2016-08-09 DIAGNOSIS — M25512 Pain in left shoulder: Secondary | ICD-10-CM | POA: Diagnosis not present

## 2016-08-09 DIAGNOSIS — M542 Cervicalgia: Secondary | ICD-10-CM | POA: Diagnosis not present

## 2016-08-09 DIAGNOSIS — R293 Abnormal posture: Secondary | ICD-10-CM | POA: Diagnosis not present

## 2016-08-09 DIAGNOSIS — X501XXD Overexertion from prolonged static or awkward postures, subsequent encounter: Secondary | ICD-10-CM | POA: Diagnosis not present

## 2016-08-14 DIAGNOSIS — R293 Abnormal posture: Secondary | ICD-10-CM | POA: Diagnosis not present

## 2016-08-14 DIAGNOSIS — X501XXD Overexertion from prolonged static or awkward postures, subsequent encounter: Secondary | ICD-10-CM | POA: Diagnosis not present

## 2016-08-14 DIAGNOSIS — M542 Cervicalgia: Secondary | ICD-10-CM | POA: Diagnosis not present

## 2016-08-14 DIAGNOSIS — M25512 Pain in left shoulder: Secondary | ICD-10-CM | POA: Diagnosis not present

## 2016-08-16 DIAGNOSIS — M542 Cervicalgia: Secondary | ICD-10-CM | POA: Diagnosis not present

## 2016-08-16 DIAGNOSIS — X501XXD Overexertion from prolonged static or awkward postures, subsequent encounter: Secondary | ICD-10-CM | POA: Diagnosis not present

## 2016-08-16 DIAGNOSIS — M25512 Pain in left shoulder: Secondary | ICD-10-CM | POA: Diagnosis not present

## 2016-08-16 DIAGNOSIS — R293 Abnormal posture: Secondary | ICD-10-CM | POA: Diagnosis not present

## 2016-08-21 DIAGNOSIS — M25512 Pain in left shoulder: Secondary | ICD-10-CM | POA: Diagnosis not present

## 2016-08-21 DIAGNOSIS — X501XXD Overexertion from prolonged static or awkward postures, subsequent encounter: Secondary | ICD-10-CM | POA: Diagnosis not present

## 2016-08-21 DIAGNOSIS — R293 Abnormal posture: Secondary | ICD-10-CM | POA: Diagnosis not present

## 2016-08-21 DIAGNOSIS — M542 Cervicalgia: Secondary | ICD-10-CM | POA: Diagnosis not present

## 2016-08-22 IMAGING — CR DG LUMBAR SPINE COMPLETE 4+V
5 series · 5 of 5 positions shown · non-contrast
Comparison: None.

CLINICAL DATA: Back pain

EXAM:
LUMBAR SPINE - COMPLETE 4+ VIEW

[AP (1 of 2)]
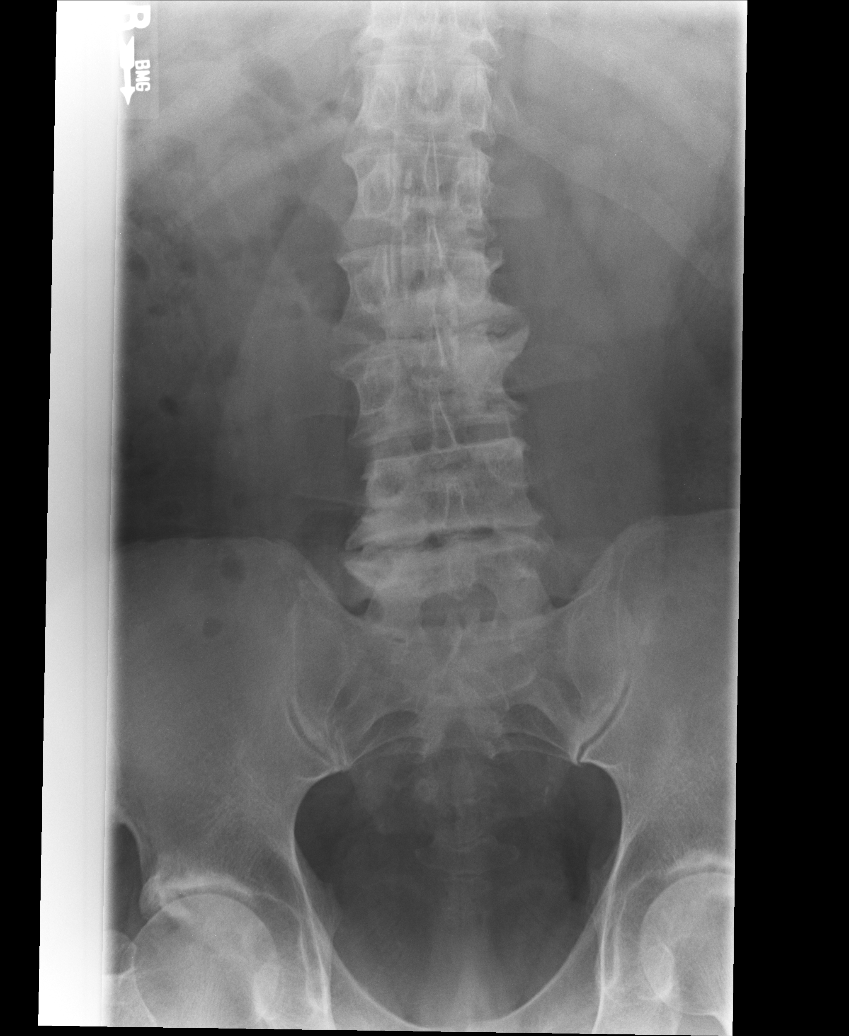

[AP (2 of 2)]
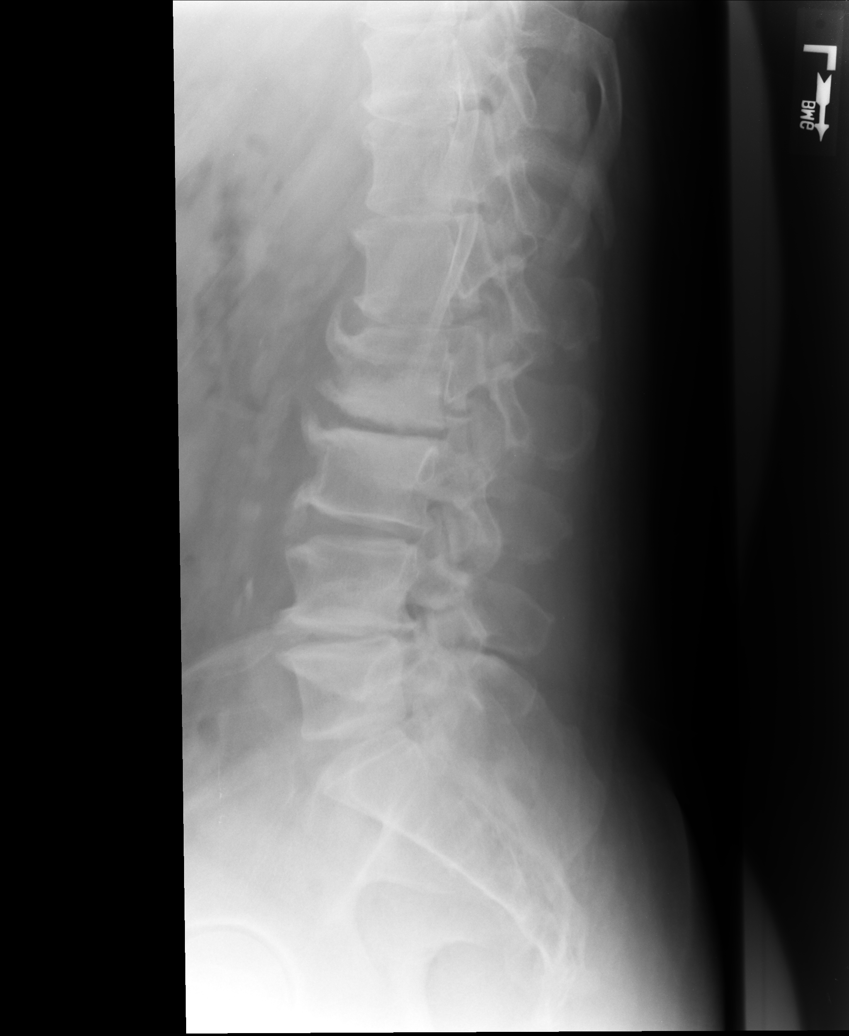

[l5 s1]
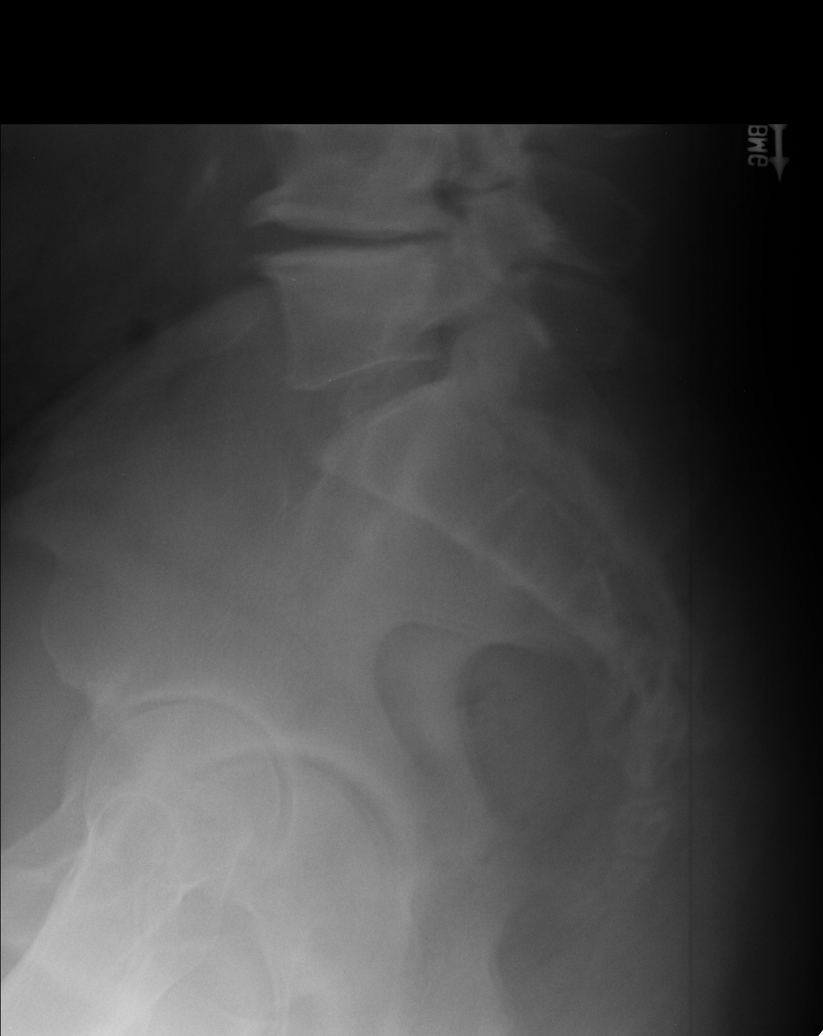

[rpo]
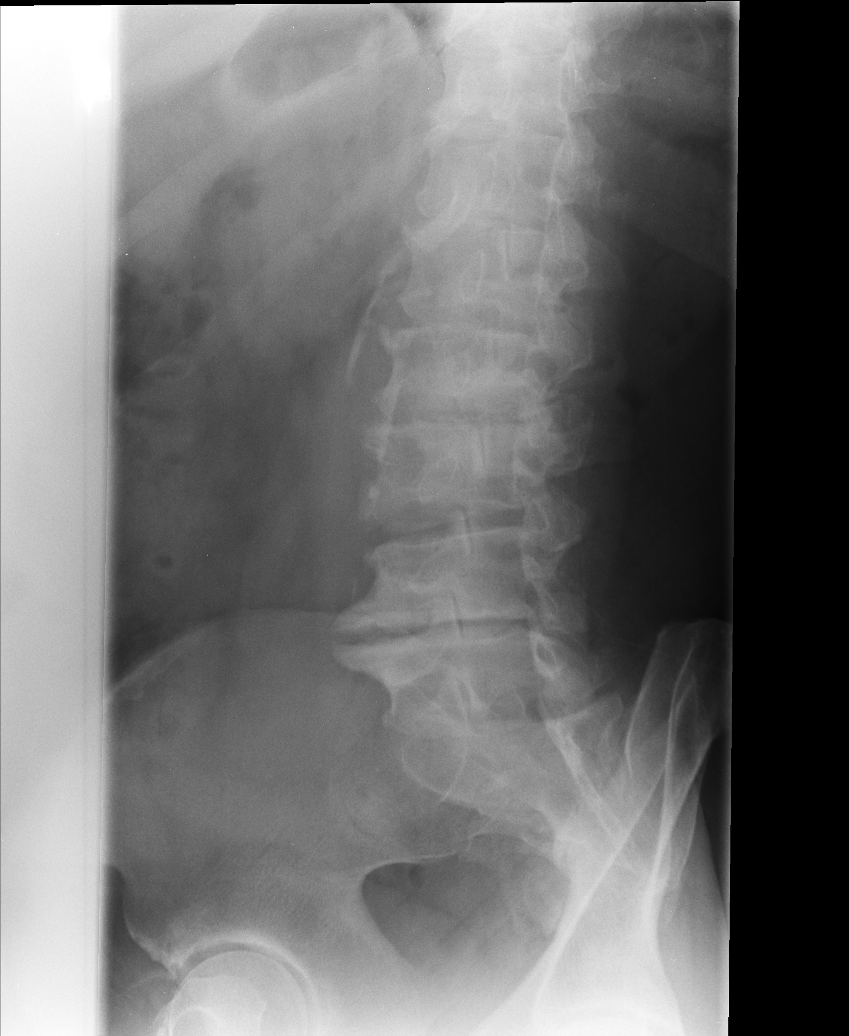

[lpo]
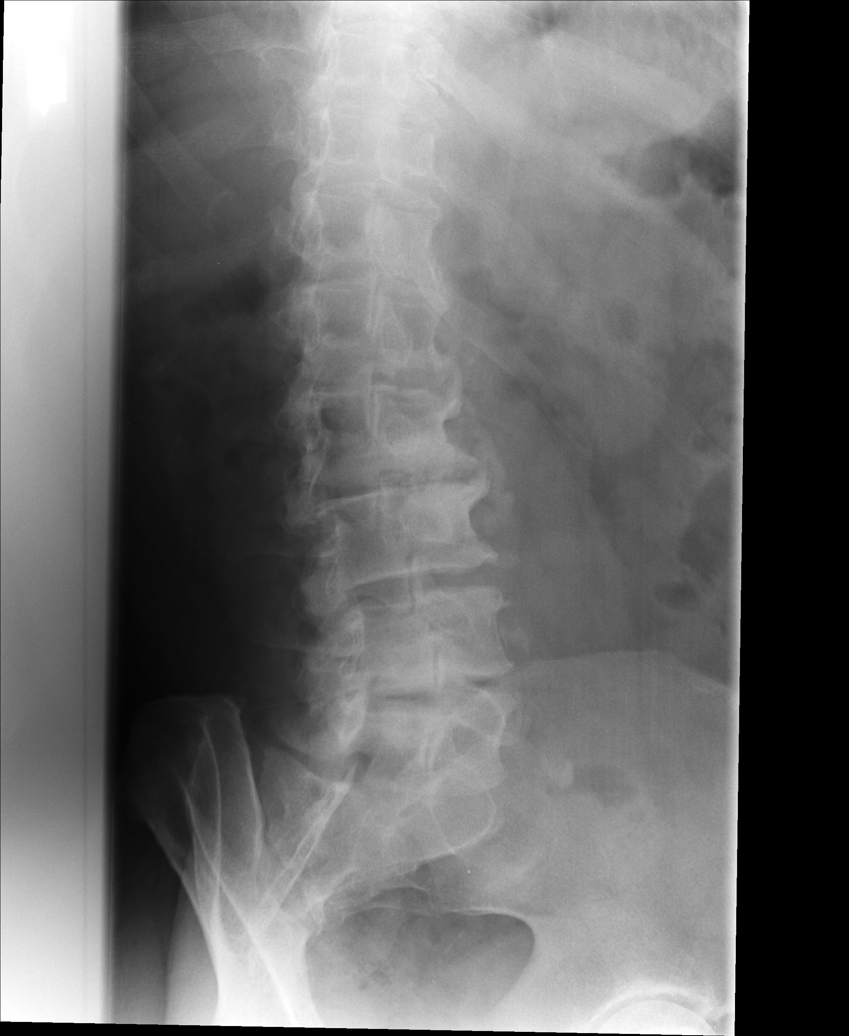

[5 of 5 positions shown; findings below may reference images not displayed]

FINDINGS: Mild curvature of the lumbar spine is convex towards the right. The
vertebral body heights are well preserved. Multi level lumbar disc
space narrowing and ventral endplate spurring is noted. This is most
advanced L2-3 and L4-5. Aortic atherosclerosis.
IMPRESSION: 1. Mild scoliosis and multi level lumbar degenerative disc disease.

## 2016-08-23 DIAGNOSIS — R293 Abnormal posture: Secondary | ICD-10-CM | POA: Diagnosis not present

## 2016-08-23 DIAGNOSIS — M25512 Pain in left shoulder: Secondary | ICD-10-CM | POA: Diagnosis not present

## 2016-08-23 DIAGNOSIS — X501XXD Overexertion from prolonged static or awkward postures, subsequent encounter: Secondary | ICD-10-CM | POA: Diagnosis not present

## 2016-08-23 DIAGNOSIS — M542 Cervicalgia: Secondary | ICD-10-CM | POA: Diagnosis not present

## 2016-08-30 DIAGNOSIS — M542 Cervicalgia: Secondary | ICD-10-CM | POA: Diagnosis not present

## 2016-08-30 DIAGNOSIS — X501XXD Overexertion from prolonged static or awkward postures, subsequent encounter: Secondary | ICD-10-CM | POA: Diagnosis not present

## 2016-08-30 DIAGNOSIS — R293 Abnormal posture: Secondary | ICD-10-CM | POA: Diagnosis not present

## 2016-08-30 DIAGNOSIS — M25512 Pain in left shoulder: Secondary | ICD-10-CM | POA: Diagnosis not present

## 2017-04-06 DIAGNOSIS — S238XXA Sprain of other specified parts of thorax, initial encounter: Secondary | ICD-10-CM | POA: Diagnosis not present

## 2017-04-06 DIAGNOSIS — R030 Elevated blood-pressure reading, without diagnosis of hypertension: Secondary | ICD-10-CM | POA: Diagnosis not present
